# Patient Record
Sex: Male | Born: 1960 | Race: White | Hispanic: No | Marital: Married | Smoking: Current every day smoker
Health system: Southern US, Community
[De-identification: ages and names within clinical notes are randomized; demographics above are authoritative.]

## PROBLEM LIST (undated history)

## (undated) HISTORY — PX: APPENDECTOMY: SHX54

## (undated) HISTORY — PX: KNEE ARTHROSCOPY: SUR90

---

## 2011-02-13 ENCOUNTER — Ambulatory Visit: Payer: Self-pay | Admitting: Internal Medicine

## 2012-03-30 ENCOUNTER — Ambulatory Visit: Payer: Self-pay | Admitting: Internal Medicine

## 2014-02-15 LAB — BASIC METABOLIC PANEL
BUN: 14 mg/dL (ref 4–21)
Creatinine: 0.9 mg/dL (ref 0.6–1.3)

## 2014-02-15 LAB — LIPID PANEL
Cholesterol: 248 mg/dL — AB (ref 0–200)
HDL: 44 mg/dL (ref 35–70)
LDL Cholesterol: 151 mg/dL
Triglycerides: 265 mg/dL — AB (ref 40–160)

## 2014-02-15 LAB — CBC AND DIFFERENTIAL: HEMOGLOBIN: 14.5 g/dL (ref 13.5–17.5)

## 2014-02-15 LAB — TSH: TSH: 4.5 u[IU]/mL (ref 0.41–5.90)

## 2014-02-15 LAB — PSA: PSA: 1.2

## 2015-11-07 ENCOUNTER — Encounter: Payer: Self-pay | Admitting: Internal Medicine

## 2015-11-07 ENCOUNTER — Other Ambulatory Visit: Payer: Self-pay | Admitting: Internal Medicine

## 2015-11-07 DIAGNOSIS — F411 Generalized anxiety disorder: Secondary | ICD-10-CM | POA: Insufficient documentation

## 2015-11-07 DIAGNOSIS — F17200 Nicotine dependence, unspecified, uncomplicated: Secondary | ICD-10-CM | POA: Insufficient documentation

## 2015-11-07 DIAGNOSIS — M179 Osteoarthritis of knee, unspecified: Secondary | ICD-10-CM | POA: Insufficient documentation

## 2015-11-07 DIAGNOSIS — I8390 Asymptomatic varicose veins of unspecified lower extremity: Secondary | ICD-10-CM | POA: Insufficient documentation

## 2015-11-07 DIAGNOSIS — M1710 Unilateral primary osteoarthritis, unspecified knee: Secondary | ICD-10-CM | POA: Insufficient documentation

## 2015-11-07 DIAGNOSIS — G4733 Obstructive sleep apnea (adult) (pediatric): Secondary | ICD-10-CM | POA: Insufficient documentation

## 2015-11-07 DIAGNOSIS — E782 Mixed hyperlipidemia: Secondary | ICD-10-CM | POA: Insufficient documentation

## 2015-11-07 DIAGNOSIS — G5600 Carpal tunnel syndrome, unspecified upper limb: Secondary | ICD-10-CM | POA: Insufficient documentation

## 2016-12-24 ENCOUNTER — Encounter: Payer: Self-pay | Admitting: Emergency Medicine

## 2016-12-24 ENCOUNTER — Ambulatory Visit
Admission: EM | Admit: 2016-12-24 | Discharge: 2016-12-24 | Disposition: A | Discharge status: Home / Self Care | Payer: Managed Care, Other (non HMO) | Attending: Family Medicine | Admitting: Family Medicine

## 2016-12-24 DIAGNOSIS — J0110 Acute frontal sinusitis, unspecified: Secondary | ICD-10-CM | POA: Diagnosis not present

## 2016-12-24 DIAGNOSIS — R05 Cough: Secondary | ICD-10-CM | POA: Diagnosis not present

## 2016-12-24 DIAGNOSIS — J40 Bronchitis, not specified as acute or chronic: Secondary | ICD-10-CM | POA: Diagnosis not present

## 2016-12-24 DIAGNOSIS — R059 Cough, unspecified: Secondary | ICD-10-CM

## 2016-12-24 MED ORDER — AZITHROMYCIN 250 MG PO TABS: ORAL_TABLET | ORAL | 0 refills | Status: DC

## 2016-12-24 MED ORDER — HYDROCOD POLST-CPM POLST ER 10-8 MG/5ML PO SUER
5.0000 mL | Freq: Two times a day (BID) | ORAL | 0 refills | Status: DC | PRN
Start: 2016-12-24 — End: 2021-01-06

## 2016-12-24 NOTE — ED Provider Notes (Signed)
[facility]    CSN: [id number] Arrival date & time: 12/24/16  1112     History   Chief Complaint Chief Complaint  Patient presents with  . Cough    HPI Nathan Little is a 56 y.o. male.   The history is provided by the patient.  Cough  URI  Presenting symptoms: congestion and cough   Severity:  Moderate Onset quality:  Sudden Duration:  7 days Timing:  Constant Progression:  Worsening Chronicity:  New Relieved by:  Nothing Worsened by:  Nothing Risk factors: sick contacts   Risk factors: not elderly, no chronic cardiac disease, no chronic kidney disease, no chronic respiratory disease, no diabetes mellitus, no immunosuppression, no recent illness and no recent travel     History reviewed. No pertinent past medical history.  Patient Active Problem List   Diagnosis Date Noted  . Carpal tunnel syndrome 11/07/2015  . Anxiety, generalized 11/07/2015  . 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 Arthritis of knee, degenerative 11/07/2015  . Obstructive apnea 11/07/2015  . Compulsive tobacco user syndrome 11/07/2015  . Leg varices 11/07/2015    Past Surgical History:  Procedure Laterality Date  . APPENDECTOMY    . KNEE ARTHROSCOPY Right        Home Medications    Prior to Admission medications   Medication Sig Start Date End Date Taking? Authorizing Provider  azithromycin (ZITHROMAX Z-PAK) 250 MG tablet 2 tabs po once day 1, then 1 tab po qd for next 4 days 12/24/16   Payton Mccallum, MD  buPROPion [facility] SR) 150 MG 12 hr tablet Take 1 tablet by mouth 2 (two) times daily. 11/01/14   Historical Provider, MD  chlorpheniramine-HYDROcodone (TUSSIONEX PENNKINETIC ER) 10-8 MG/5ML SUER Take 5 mLs by mouth every 12 (twelve) hours as needed. 12/24/16   Payton Mccallum, MD    Family History Family History  Problem Relation Age of Onset  . Diabetes Mother   . CAD Mother     Social History Social History  Substance Use Topics  .  Smoking status: Current Every Day Smoker  . Smokeless tobacco: Never Used  . Alcohol use 8.4 oz/week    14 Standard drinks or equivalent per week     Allergies   Patient has no known allergies.   Review of Systems Review of Systems  HENT: Positive for congestion.   Respiratory: Positive for cough.      Physical Exam Triage Vital Signs ED Triage Vitals  Enc Vitals Group     BP 12/24/16 1200 124/67     Pulse Rate 12/24/16 1200 81     Resp 12/24/16 1200 18     Temp 12/24/16 1200 97.7 F (36.5 C)     Temp Source 12/24/16 1200 Oral     SpO2 12/24/16 1200 95 %     Weight 12/24/16 1203 290 lb (131.5 kg)     Height 12/24/16 1203 5\' 9"  (1.753 m)     Head Circumference --      Peak Flow --      Pain Score 12/24/16 1206 3     Pain Loc --      Pain Edu? --      Excl. in GC? --    No data found.   Updated Vital Signs BP 124/67 (BP Location: Left Arm)   Pulse 81   Temp 97.7 F (36.5 C) (Oral)   Resp 18   Ht 5\' 9"  (1.753 m)   Wt 290 lb (  131.5 kg)   SpO2 95%   BMI 42.83 kg/m   Visual Acuity Right Eye Distance:   Left Eye Distance:   Bilateral Distance:    Right Eye Near:   Left Eye Near:    Bilateral Near:     Physical Exam  Constitutional: He appears well-developed and well-nourished. No distress.  HENT:  Head: Normocephalic and atraumatic.  Right Ear: Tympanic membrane, external ear and ear canal normal.  Left Ear: Tympanic membrane, external ear and ear canal normal.  Nose: Mucosal edema and rhinorrhea present. Right sinus exhibits maxillary sinus tenderness and frontal sinus tenderness. Left sinus exhibits maxillary sinus tenderness and frontal sinus tenderness.  Mouth/Throat: Uvula is midline, oropharynx is clear and moist and mucous membranes are normal. No oropharyngeal exudate or tonsillar abscesses.  Eyes: Conjunctivae and EOM are normal. Pupils are equal, round, and reactive to light. Right eye exhibits no discharge. Left eye exhibits no discharge. No  scleral icterus.  Neck: Normal range of motion. Neck supple. No tracheal deviation present. No thyromegaly present.  Cardiovascular: Normal rate, regular rhythm and normal heart sounds.   Pulmonary/Chest: Effort normal. No stridor. No respiratory distress. He has no wheezes. He has no rales. He exhibits no tenderness.  Rhonchi left lung  Lymphadenopathy:    He has no cervical adenopathy.  Neurological: He is alert.  Skin: Skin is warm and dry. No rash noted. He is not diaphoretic.  Nursing note and vitals reviewed.    UC Treatments / Results  Labs (all labs ordered are listed, but only abnormal results are displayed) Labs Reviewed - No data to display  EKG  EKG Interpretation None       Radiology No results found.  Procedures Procedures (including critical care time)  Medications Ordered in UC Medications - No data to display   Initial Impression / Assessment and Plan / UC Course  I have reviewed the triage vital signs and the nursing notes.  Pertinent labs & imaging results that were available during my care of the patient were reviewed by me and considered in my medical decision making (see chart for details).  Clinical Course       Final Clinical Impressions(s) / UC Diagnoses   Final diagnoses:  Cough  Bronchitis  Acute frontal sinusitis, recurrence not specified    New Prescriptions Discharge Medication List as of 12/24/2016 12:40 PM    START taking these medications   Details  azithromycin (ZITHROMAX Z-PAK) 250 MG tablet 2 tabs po once day 1, then 1 tab po qd for next 4 days, Normal    chlorpheniramine-HYDROcodone (TUSSIONEX PENNKINETIC ER) 10-8 MG/5ML SUER Take 5 mLs by mouth every 12 (twelve) hours as needed., Starting Fri 12/24/2016, Normal       1.  diagnosis reviewed with patient 2. rx as per orders above; reviewed possible side effects, interactions, risks and benefits  3. Recommend supportive treatment with fluids, rest 4. Follow-up prn if  symptoms worsen or don't improve   Payton Mccallumrlando Magen Suriano, MD 12/24/16 1427

## 2016-12-24 NOTE — ED Triage Notes (Signed)
Patient states he has had a cough and congestion for the past week, it is much worse at night.

## 2017-12-19 ENCOUNTER — Other Ambulatory Visit: Payer: Self-pay

## 2017-12-19 DIAGNOSIS — Z1212 Encounter for screening for malignant neoplasm of rectum: Principal | ICD-10-CM

## 2017-12-19 DIAGNOSIS — Z1211 Encounter for screening for malignant neoplasm of colon: Secondary | ICD-10-CM

## 2017-12-19 NOTE — Progress Notes (Signed)
Gastroenterology Pre-Procedure Review  Request Date: 03/10/18 Requesting Physician: Dr. Servando SnareWohl  PATIENT REVIEW QUESTIONS: The patient responded to the following health history questions as indicated:    1. Are you having any GI issues? no 2. Do you have a personal history of Polyps? no 3. Do you have a family history of Colon Cancer or Polyps? no 4. Diabetes Mellitus? no 5. Joint replacements in the past 12 months?no 6. Major health problems in the past 3 months?no 7. Any artificial heart valves, MVP, or defibrillator?no    MEDICATIONS & ALLERGIES:    Patient reports the following regarding taking any anticoagulation/antiplatelet therapy:   Plavix, Coumadin, Eliquis, Xarelto, Lovenox, Pradaxa, Brilinta, or Effient? no Aspirin? no  Patient confirms/reports the following medications:  Current Outpatient Medications  Medication Sig Dispense Refill  . azithromycin (ZITHROMAX Z-PAK) 250 MG tablet 2 tabs po once day 1, then 1 tab po qd for next 4 days 6 each 0  . buPROPion (WELLBUTRIN SR) 150 MG 12 hr tablet Take 1 tablet by mouth 2 (two) times daily.    . chlorpheniramine-HYDROcodone (TUSSIONEX PENNKINETIC ER) 10-8 MG/5ML SUER Take 5 mLs by mouth every 12 (twelve) hours as needed. 120 mL 0   No current facility-administered medications for this visit.     Patient confirms/reports the following allergies:  No Known Allergies  No orders of the defined types were placed in this encounter.   AUTHORIZATION INFORMATION Primary Insurance: 1D#: Group #:  Secondary Insurance: 1D#: Group #:  SCHEDULE INFORMATION: Date: 3/29 Time: Location: [facility]

## 2018-03-10 ENCOUNTER — Ambulatory Visit
Admission: RE | Admit: 2018-03-10 | Payer: BLUE CROSS/BLUE SHIELD | Source: Ambulatory Visit | Admitting: Gastroenterology

## 2018-03-10 ENCOUNTER — Encounter: Admission: RE | Payer: Self-pay | Source: Ambulatory Visit

## 2018-03-10 SURGERY — COLONOSCOPY WITH PROPOFOL
Anesthesia: Choice

## 2021-01-06 ENCOUNTER — Encounter: Payer: Self-pay | Admitting: Emergency Medicine

## 2021-01-06 ENCOUNTER — Ambulatory Visit (INDEPENDENT_AMBULATORY_CARE_PROVIDER_SITE_OTHER): Payer: PRIVATE HEALTH INSURANCE

## 2021-01-06 ENCOUNTER — Other Ambulatory Visit: Payer: Self-pay

## 2021-01-06 ENCOUNTER — Ambulatory Visit
Admission: EM | Admit: 2021-01-06 | Discharge: 2021-01-06 | Disposition: A | Discharge status: Home / Self Care | Payer: PRIVATE HEALTH INSURANCE | Attending: Family Medicine | Admitting: Family Medicine

## 2021-01-06 DIAGNOSIS — B9789 Other viral agents as the cause of diseases classified elsewhere: Secondary | ICD-10-CM | POA: Diagnosis present

## 2021-01-06 DIAGNOSIS — R0602 Shortness of breath: Secondary | ICD-10-CM

## 2021-01-06 DIAGNOSIS — J988 Other specified respiratory disorders: Secondary | ICD-10-CM

## 2021-01-06 DIAGNOSIS — R059 Cough, unspecified: Secondary | ICD-10-CM | POA: Diagnosis not present

## 2021-01-06 DIAGNOSIS — Z20822 Contact with and (suspected) exposure to covid-19: Secondary | ICD-10-CM | POA: Diagnosis present

## 2021-01-06 MED ORDER — ALBUTEROL SULFATE HFA 108 (90 BASE) MCG/ACT IN AERS: 1.0000 | INHALATION_SPRAY | Freq: Four times a day (QID) | RESPIRATORY_TRACT | 0 refills | Status: AC | PRN

## 2021-01-06 MED ORDER — PROMETHAZINE-DM 6.25-15 MG/5ML PO SYRP: 5.0000 mL | ORAL_SOLUTION | Freq: Four times a day (QID) | ORAL | 0 refills | Status: AC | PRN

## 2021-01-06 MED ORDER — PREDNISONE 50 MG PO TABS: ORAL_TABLET | ORAL | 0 refills | Status: AC

## 2021-01-06 NOTE — ED Provider Notes (Signed)
[facility]    CSN: [id number] Arrival date & time: 01/06/21  1030      History   Chief Complaint Chief Complaint  Patient presents with  . Cough  . Generalized Body Aches  . Sore Throat   HPI  60 year old male presents with the above complaints.   Patient states that he has been sick for the past 3 days.  Reports cough, shortness of breath, headache, nasal congestion, body aches. No fever. + Chills. No reported sick contacts. No relieving factors. No other associated symptoms. No other complaints.   Patient Active Problem List   Diagnosis Date Noted  . Carpal tunnel syndrome 11/07/2015  . Anxiety, generalized 11/07/2015  . 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 Arthritis of knee, degenerative 11/07/2015  . Obstructive apnea 11/07/2015  . Compulsive tobacco user syndrome 11/07/2015  . Leg varices 11/07/2015   Past Surgical History:  Procedure Laterality Date  . APPENDECTOMY    . KNEE ARTHROSCOPY Right     Home Medications    Prior to Admission medications   Medication Sig Start Date End Date Taking? Authorizing Provider  albuterol (VENTOLIN HFA) 108 (90 Base) MCG/ACT inhaler Inhale 1-2 puffs into the lungs every 6 (six) hours as needed for wheezing or shortness of breath. 01/06/21  Yes Daaiyah Baumert G, DO  meloxicam (MOBIC) 15 MG tablet Take by mouth. 03/28/19  Yes [provider]  predniSONE (DELTASONE) 50 MG tablet 1 tablet daily x 5 days 01/06/21  Yes Shamica Moree G, DO  promethazine-dextromethorphan (PROMETHAZINE-DM) 6.25-15 MG/5ML syrup Take 5 mLs by mouth 4 (four) times daily as needed for cough. 01/06/21  Yes Breeze Angell G, DO  sertraline (ZOLOFT) 50 MG tablet Take 50 mg by mouth daily. 09/19/20  Yes [provider]  buPROPion (WELLBUTRIN SR) 150 MG 12 hr tablet Take 1 tablet by mouth 2 (two) times daily. 11/01/14 01/06/21  [provider]    Family History Family History  Problem Relation Age of Onset   . Diabetes Mother   . CAD Mother     Social History Social History   Tobacco Use  . Smoking status: Current Every Day Smoker  . Smokeless tobacco: Never Used  Substance Use Topics  . Alcohol use: Yes    Alcohol/week: 14.0 standard drinks    Types: 14 Standard drinks or equivalent per week     Allergies   Patient has no known allergies.   Review of Systems Review of Systems  HENT: Positive for congestion.   Respiratory: Positive for cough and shortness of breath.   Musculoskeletal:       Body aches.  Neurological: Positive for headaches.   Physical Exam Triage Vital Signs ED Triage Vitals  Enc Vitals Group     BP 01/06/21 1043 132/90     Pulse Rate 01/06/21 1043 86     Resp 01/06/21 1043 20     Temp 01/06/21 1043 98 F (36.7 C)     Temp Source 01/06/21 1043 Oral     SpO2 01/06/21 1043 99 %     Weight 01/06/21 1040 300 lb (136.1 kg)     Height 01/06/21 1040 5\' 10"  (1.778 m)     Head Circumference --      Peak Flow --      Pain Score 01/06/21 1039 4     Pain Loc --      Pain Edu? --      Excl. in GC? --  Updated Vital Signs BP 132/90 (BP Location: Left Arm)   Pulse 86   Temp 98 F (36.7 C) (Oral)   Resp 20   Ht 5\' 10"  (1.778 m)   Wt 136.1 kg   SpO2 99%   BMI 43.05 kg/m   Visual Acuity Right Eye Distance:   Left Eye Distance:   Bilateral Distance:    Right Eye Near:   Left Eye Near:    Bilateral Near:     Physical Exam Vitals and nursing note reviewed.  Constitutional:      General: He is not in acute distress.    Appearance: Normal appearance. He is not ill-appearing.  HENT:     Head: Normocephalic and atraumatic.  Eyes:     General:        Right eye: No discharge.        Left eye: No discharge.     Conjunctiva/sclera: Conjunctivae normal.  Cardiovascular:     Rate and Rhythm: Normal rate and regular rhythm.     Heart sounds: No murmur heard.   Pulmonary:     Effort: Pulmonary effort is normal.     Breath sounds: Normal breath  sounds. No wheezing or rales.  Neurological:     Mental Status: He is alert.  Psychiatric:        Mood and Affect: Mood normal.        Behavior: Behavior normal.    UC Treatments / Results  Labs (all labs ordered are listed, but only abnormal results are displayed) Labs Reviewed  SARS CORONAVIRUS 2 (TAT 6-24 HRS)    EKG   Radiology DG Chest 2 View  Result Date: 01/06/2021 CLINICAL DATA:  Shortness of breath and cough for several days EXAM: CHEST - 2 VIEW COMPARISON:  03/30/2012 FINDINGS: The heart size and mediastinal contours are within normal limits. Both lungs are clear. The visualized skeletal structures are unremarkable. IMPRESSION: No active cardiopulmonary disease. Electronically Signed   By: 04/01/2012 M.D.   On: 01/06/2021 11:58    Procedures Procedures (including critical care time)  Medications Ordered in UC Medications - No data to display  Initial Impression / Assessment and Plan / UC Course  I have reviewed the triage vital signs and the nursing notes.  Pertinent labs & imaging results that were available during my care of the patient were reviewed by me and considered in my medical decision making (see chart for details).    60 year old male presents with a viral respiratory infection. Suspected COVID.  Awaiting Covid test results.  Chest x-ray was obtained and was independent reviewed by me.  Interpretation:  No acute cardiopulmonary abnormalities.  No evidence of pneumonia.  Treating with prednisone and albuterol and Promethazine DM.  Supportive care.  Final Clinical Impressions(s) / UC Diagnoses   Final diagnoses:  Viral respiratory infection  Suspected COVID-19 virus infection     Discharge Instructions     Medication as prescribed.  Stay home.  Check my chart for COVID test results.  Take care  Dr. 60     ED Prescriptions    Medication Sig Dispense Auth. Provider   predniSONE (DELTASONE) 50 MG tablet 1 tablet daily x 5 days 5  tablet Holton Sidman G, DO   albuterol (VENTOLIN HFA) 108 (90 Base) MCG/ACT inhaler Inhale 1-2 puffs into the lungs every 6 (six) hours as needed for wheezing or shortness of breath. 18 g Keeva Reisen G, DO   promethazine-dextromethorphan (PROMETHAZINE-DM) 6.25-15 MG/5ML syrup Take 5  mLs by mouth 4 (four) times daily as needed for cough. 118 mL Tommie Sams, DO     PDMP not reviewed this encounter.   Tommie Sams, [place] 01/06/21 1316

## 2021-01-06 NOTE — ED Triage Notes (Signed)
Pt presents with cough, SOB, headache, nasal congestion and body aches x 3 days.

## 2021-01-06 NOTE — Discharge Instructions (Signed)
Medication as prescribed.  Stay home.  Check my chart for COVID test results.  Take care  Dr. Bryanda Mikel   

## 2021-01-07 LAB — SARS CORONAVIRUS 2 (TAT 6-24 HRS): SARS Coronavirus 2: NEGATIVE

## 2021-01-28 ENCOUNTER — Other Ambulatory Visit: Payer: Self-pay | Admitting: Family Medicine

## 2021-03-06 IMAGING — CR DG CHEST 2V
2 series · 3 of 3 positions shown · non-contrast
Comparison: 03/30/2012

CLINICAL DATA: Shortness of breath and cough for several days

EXAM:
CHEST - 2 VIEW

[chest pa]
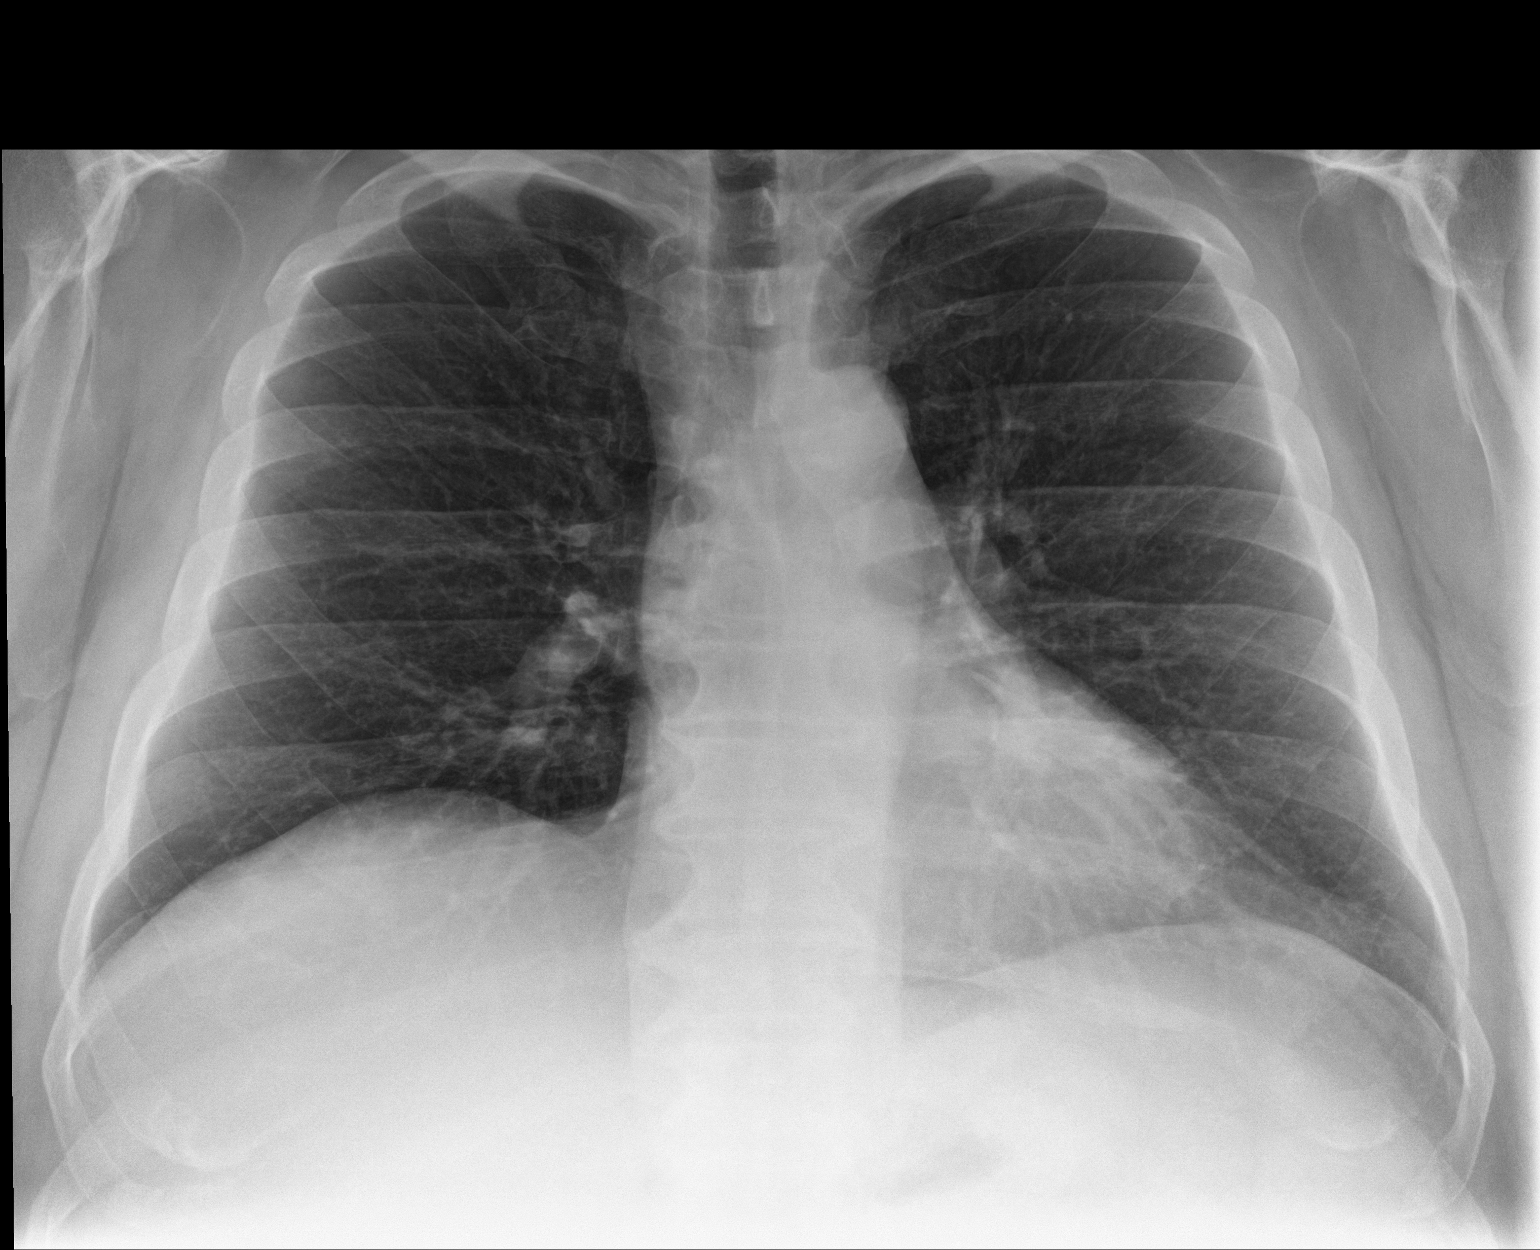

[Series 2: chest lat · 0.14mm/px · 2 of 2 slices shown]
[im 1/2]
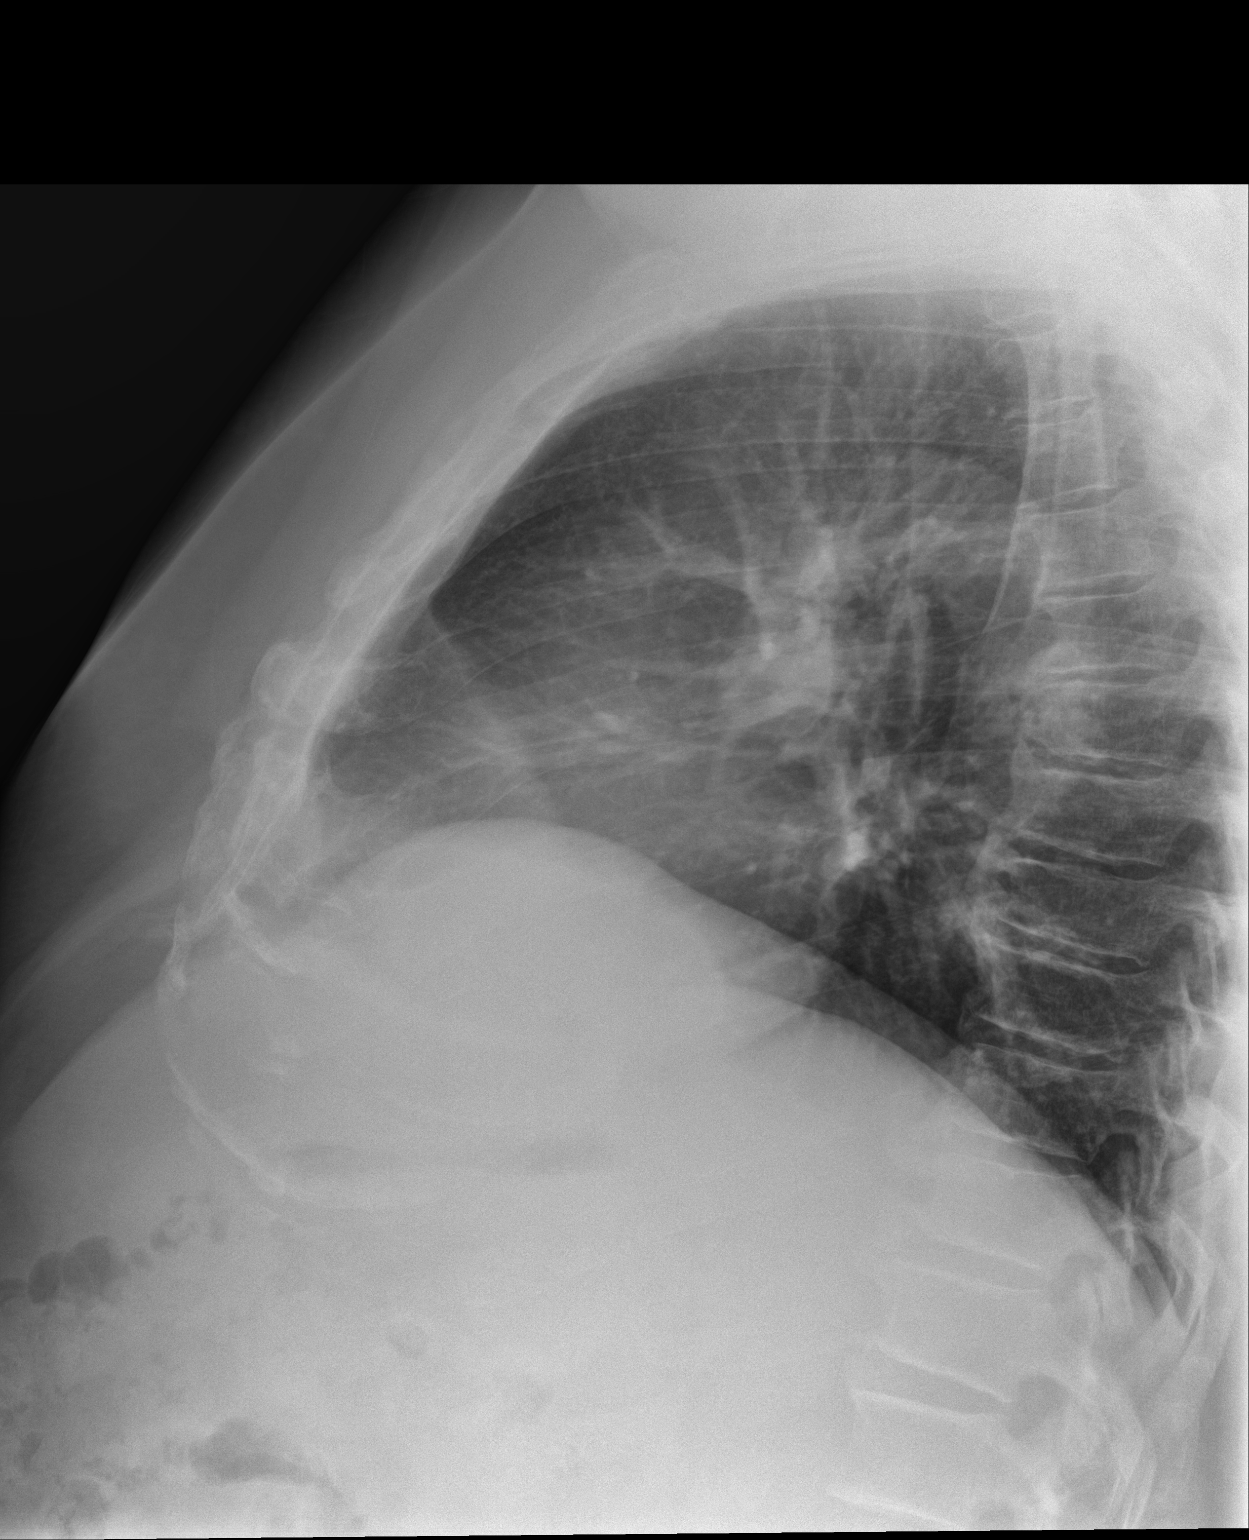
[im 2/2]
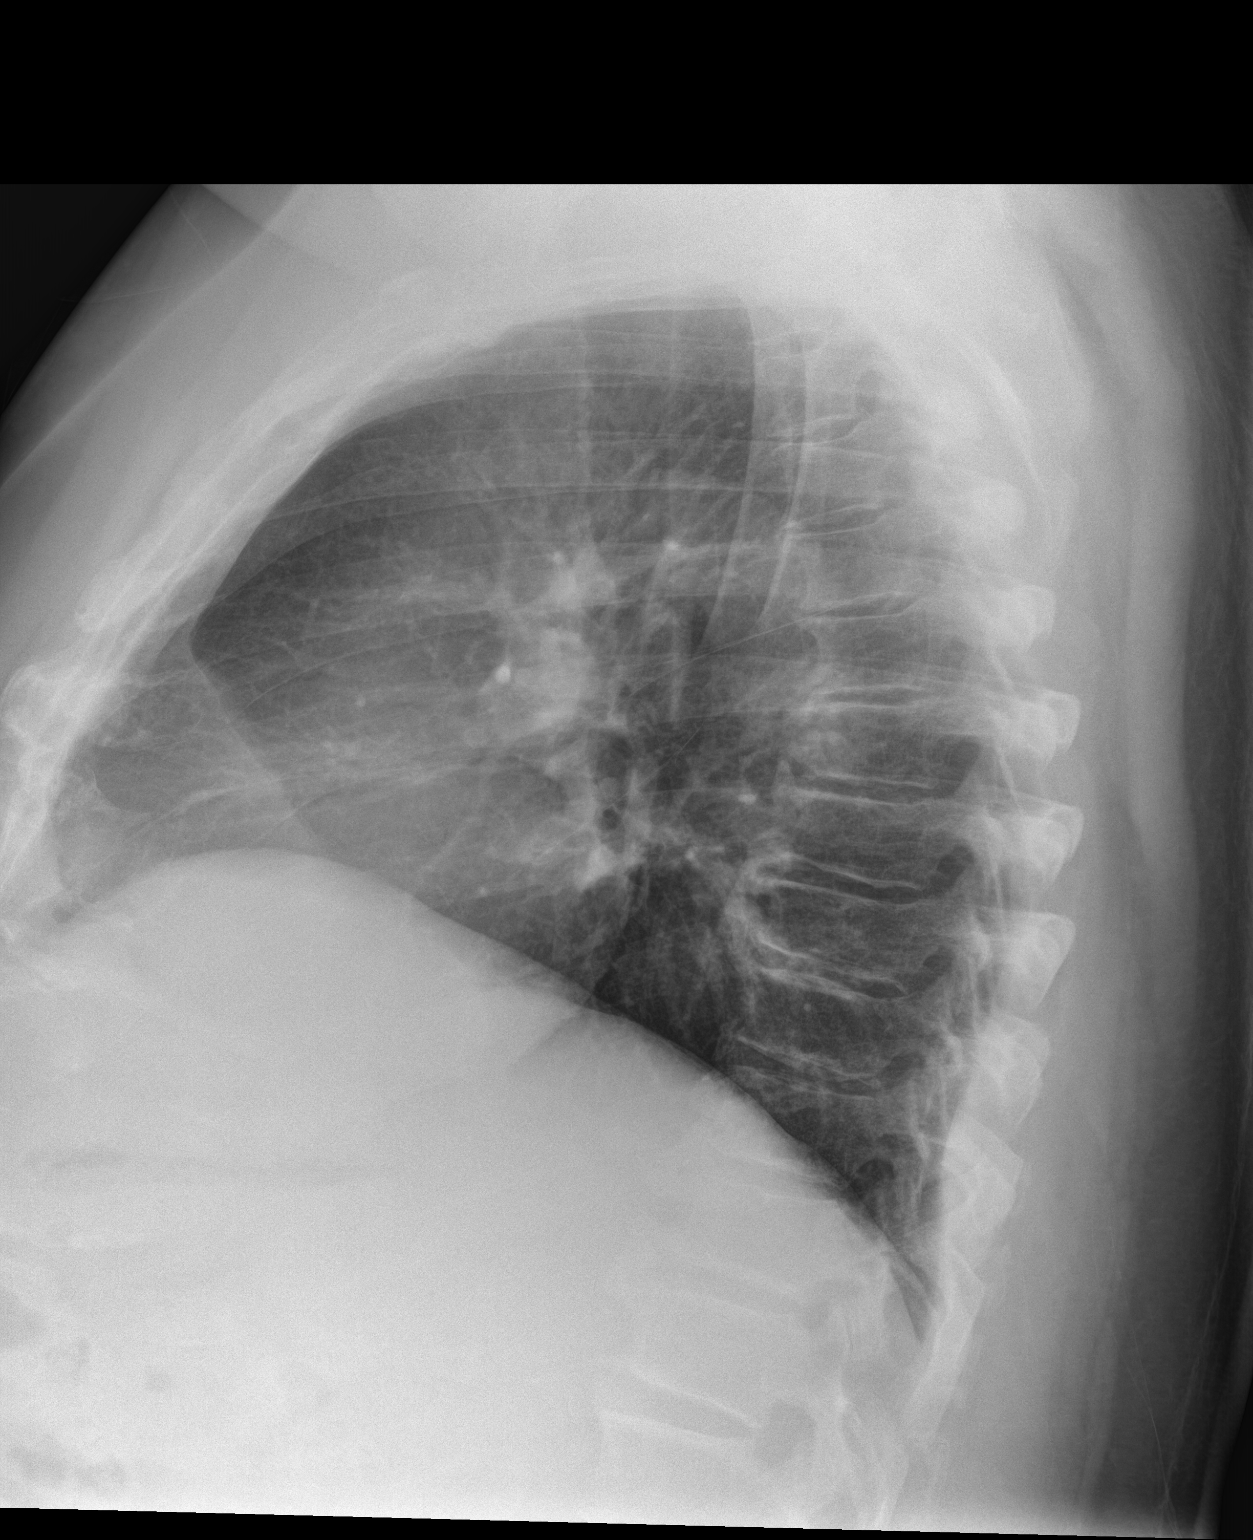

[3 of 3 positions shown; findings below may reference images not displayed]

FINDINGS: The heart size and mediastinal contours are within normal limits.
Both lungs are clear. The visualized skeletal structures are
unremarkable.
IMPRESSION: No active cardiopulmonary disease.
